# Patient Record
Sex: Female | Born: 1960 | Race: White | Hispanic: No | Marital: Single | Smoking: Never smoker
Health system: Southern US, Community
[De-identification: ages and names within clinical notes are randomized; demographics above are authoritative.]

## PROBLEM LIST (undated history)

## (undated) DIAGNOSIS — I10 Essential (primary) hypertension: Secondary | ICD-10-CM

## (undated) DIAGNOSIS — E0500 Thyrotoxicosis with diffuse goiter without thyrotoxic crisis or storm: Secondary | ICD-10-CM

## (undated) DIAGNOSIS — E039 Hypothyroidism, unspecified: Secondary | ICD-10-CM

## (undated) HISTORY — PX: COLONOSCOPY: SHX174

---

## 2004-06-25 ENCOUNTER — Ambulatory Visit: Payer: Self-pay | Admitting: Unknown Physician Specialty

## 2004-08-12 ENCOUNTER — Ambulatory Visit: Payer: Self-pay | Admitting: Unknown Physician Specialty

## 2005-11-15 ENCOUNTER — Ambulatory Visit: Payer: Self-pay | Admitting: Unknown Physician Specialty

## 2007-01-10 ENCOUNTER — Ambulatory Visit: Payer: Self-pay | Admitting: Unknown Physician Specialty

## 2007-02-02 ENCOUNTER — Ambulatory Visit: Payer: Self-pay | Admitting: Unknown Physician Specialty

## 2008-02-21 ENCOUNTER — Ambulatory Visit: Payer: Self-pay | Admitting: Unknown Physician Specialty

## 2009-06-02 ENCOUNTER — Ambulatory Visit: Payer: Self-pay | Admitting: Unknown Physician Specialty

## 2010-06-07 ENCOUNTER — Ambulatory Visit: Payer: Self-pay | Admitting: Unknown Physician Specialty

## 2011-09-15 ENCOUNTER — Ambulatory Visit: Payer: Self-pay | Admitting: Unknown Physician Specialty

## 2012-08-20 ENCOUNTER — Ambulatory Visit: Payer: Self-pay | Admitting: Gastroenterology

## 2012-09-18 ENCOUNTER — Ambulatory Visit: Payer: Self-pay | Admitting: Physician Assistant

## 2013-09-19 ENCOUNTER — Ambulatory Visit: Payer: Self-pay | Admitting: Physician Assistant

## 2013-09-25 ENCOUNTER — Ambulatory Visit: Payer: Self-pay | Admitting: Physician Assistant

## 2014-10-06 ENCOUNTER — Ambulatory Visit: Payer: Self-pay | Admitting: Physician Assistant

## 2015-09-17 ENCOUNTER — Other Ambulatory Visit: Payer: Self-pay | Admitting: Physician Assistant

## 2015-09-17 DIAGNOSIS — Z1231 Encounter for screening mammogram for malignant neoplasm of breast: Secondary | ICD-10-CM

## 2015-10-08 ENCOUNTER — Ambulatory Visit
Admission: RE | Admit: 2015-10-08 | Discharge: 2015-10-08 | Disposition: A | Discharge status: Home / Self Care | Payer: BLUE CROSS/BLUE SHIELD | Source: Ambulatory Visit | Attending: Physician Assistant | Admitting: Physician Assistant

## 2015-10-08 ENCOUNTER — Ambulatory Visit: Payer: Self-pay

## 2015-10-08 DIAGNOSIS — Z1231 Encounter for screening mammogram for malignant neoplasm of breast: Secondary | ICD-10-CM | POA: Insufficient documentation

## 2015-11-20 ENCOUNTER — Encounter: Payer: Self-pay | Admitting: *Deleted

## 2015-11-23 ENCOUNTER — Encounter
Admission: RE | Disposition: A | Discharge status: Home / Self Care | Payer: Self-pay | Source: Ambulatory Visit | Attending: Gastroenterology

## 2015-11-23 ENCOUNTER — Encounter: Payer: Self-pay | Admitting: *Deleted

## 2015-11-23 ENCOUNTER — Ambulatory Visit: Payer: BLUE CROSS/BLUE SHIELD | Admitting: Anesthesiology

## 2015-11-23 ENCOUNTER — Ambulatory Visit
Admission: RE | Admit: 2015-11-23 | Discharge: 2015-11-23 | Disposition: A | Discharge status: Home / Self Care | Payer: BLUE CROSS/BLUE SHIELD | Source: Ambulatory Visit | Attending: Gastroenterology | Admitting: Gastroenterology

## 2015-11-23 DIAGNOSIS — Z79899 Other long term (current) drug therapy: Secondary | ICD-10-CM | POA: Insufficient documentation

## 2015-11-23 DIAGNOSIS — I10 Essential (primary) hypertension: Secondary | ICD-10-CM | POA: Diagnosis not present

## 2015-11-23 DIAGNOSIS — K5730 Diverticulosis of large intestine without perforation or abscess without bleeding: Secondary | ICD-10-CM | POA: Insufficient documentation

## 2015-11-23 DIAGNOSIS — K621 Rectal polyp: Secondary | ICD-10-CM | POA: Diagnosis not present

## 2015-11-23 DIAGNOSIS — Z1211 Encounter for screening for malignant neoplasm of colon: Secondary | ICD-10-CM | POA: Diagnosis not present

## 2015-11-23 DIAGNOSIS — E039 Hypothyroidism, unspecified: Secondary | ICD-10-CM | POA: Diagnosis not present

## 2015-11-23 HISTORY — DX: Thyrotoxicosis with diffuse goiter without thyrotoxic crisis or storm: E05.00

## 2015-11-23 HISTORY — PX: COLONOSCOPY WITH PROPOFOL: SHX5780

## 2015-11-23 HISTORY — DX: Hypothyroidism, unspecified: E03.9

## 2015-11-23 HISTORY — DX: Essential (primary) hypertension: I10

## 2015-11-23 SURGERY — COLONOSCOPY WITH PROPOFOL
Anesthesia: General

## 2015-11-23 MED ORDER — PROPOFOL 10 MG/ML IV BOLUS
INTRAVENOUS | Status: DC | PRN
  Administered 2015-11-23 (×2): 40 mg via INTRAVENOUS

## 2015-11-23 MED ORDER — SODIUM CHLORIDE 0.9 % IV SOLN
INTRAVENOUS | Status: DC
  Administered 2015-11-23: 12:00:00 via INTRAVENOUS
  Administered 2015-11-23: 1000 mL via INTRAVENOUS

## 2015-11-23 MED ORDER — MIDAZOLAM HCL 5 MG/5ML IJ SOLN
INTRAMUSCULAR | Status: DC | PRN
  Administered 2015-11-23: 1 mg via INTRAVENOUS

## 2015-11-23 MED ORDER — PROPOFOL 500 MG/50ML IV EMUL
INTRAVENOUS | Status: DC | PRN
Start: 2015-11-23 — End: 2015-11-23
  Administered 2015-11-23: 150 ug/kg/min via INTRAVENOUS

## 2015-11-23 MED ORDER — SODIUM CHLORIDE 0.9 % IV SOLN
INTRAVENOUS | Status: DC
  Administered 2015-11-23: 12:00:00 via INTRAVENOUS

## 2015-11-23 MED ORDER — FENTANYL CITRATE (PF) 100 MCG/2ML IJ SOLN
INTRAMUSCULAR | Status: DC | PRN
  Administered 2015-11-23: 50 ug via INTRAVENOUS

## 2015-11-23 NOTE — Anesthesia Preprocedure Evaluation (Signed)
Anesthesia Evaluation  Patient identified by MRN, date of birth, ID band Patient awake    Reviewed: Allergy & Precautions, NPO status   Airway Mallampati: II       Dental  (+) Teeth Intact   Pulmonary neg pulmonary ROS,    Pulmonary exam normal        Cardiovascular Exercise Tolerance: Good hypertension, Pt. on medications  Rhythm:Regular     Neuro/Psych negative neurological ROS     GI/Hepatic Neg liver ROS,   Endo/Other  Hypothyroidism   Renal/GU negative Renal ROS     Musculoskeletal negative musculoskeletal ROS (+)   Abdominal Normal abdominal exam  (+)   Peds  Hematology   Anesthesia Other Findings   Reproductive/Obstetrics negative OB ROS                             Anesthesia Physical Anesthesia Plan  ASA: II  Anesthesia Plan: General   Post-op Pain Management:    Induction: Intravenous  Airway Management Planned: Natural Airway and Nasal Cannula  Additional Equipment:   Intra-op Plan:   Post-operative Plan:   Informed Consent: I have reviewed the patients History and Physical, chart, labs and discussed the procedure including the risks, benefits and alternatives for the proposed anesthesia with the patient or authorized representative who has indicated his/her understanding and acceptance.     Plan Discussed with: CRNA  Anesthesia Plan Comments:         Anesthesia Quick Evaluation

## 2015-11-23 NOTE — Op Note (Signed)
[facility] Gastroenterology Patient Name: Martha Cohen Procedure Date: 11/23/2015 11:18 AM MRN: [id number] Account #: [id number] Date of Birth: 04/09/1961 Admit Type: Outpatient Age: 55 Room: [facility] ENDO ROOM 3 Gender: Female Note Status: Finalized Procedure:            Colonoscopy Indications:          Personal history of colonic polyps Providers:            Christena Deem, MD Referring MD:         Marilynne Halsted, MD (Referring MD) Medicines:            Monitored Anesthesia Care Complications:        No immediate complications. Procedure:            Pre-Anesthesia Assessment:                       - ASA Grade Assessment: II - A patient with mild                        systemic disease.                       After obtaining informed consent, the colonoscope was                        passed under direct vision. Throughout the procedure,                        the patient's blood pressure, pulse, and oxygen                        saturations were monitored continuously. The                        Colonoscope was introduced through the anus and                        advanced to the the cecum, identified by appendiceal                        orifice and ileocecal valve. The quality of the bowel                        preparation was good. Findings:      Multiple small-mouthed diverticula were found in the sigmoid colon and       descending colon.      A localized area of mildly granular mucosa was found at the ileocecal       valve. This was biopsied/removed with a cold forceps for histology.      A 2 mm polyp was found in the rectum. The polyp was sessile. The polyp       was removed with a cold biopsy forceps. Resection and retrieval were       complete.      The digital rectal exam was normal. Impression:           - Diverticulosis in the sigmoid colon and in the                        descending colon.                       -  Granular mucosa  at the ileocecal valve. Biopsied.                       - One 2 mm polyp in the rectum, removed with a cold                        biopsy forceps. Resected and retrieved. Recommendation:       - Discharge patient to home.                       - Await pathology results.                       - Telephone GI clinic for pathology results in 1 week. Procedure Code(s):    --- Professional ---                       [phone], Colonoscopy, flexible; with biopsy, single or                        multiple Diagnosis Code(s):    --- Professional ---                       K63.89, Other specified diseases of intestine                       K62.1, Rectal polyp                       Z86.010, Personal history of colonic polyps                       K57.30, Diverticulosis of large intestine without                        perforation or abscess without bleeding CPT copyright 2016 American [employer]. All rights reserved. The codes documented in this report are preliminary and upon coder review may  be revised to meet current compliance requirements. Christena DeemMartin U Skulskie, MD 11/23/2015 12:03:18 PM This report has been signed electronically. Number of Addenda: 0 Note Initiated On: 11/23/2015 11:18 AM Scope Withdrawal Time: 0 hours 12 minutes 56 seconds  Total Procedure Duration: 0 hours 20 minutes 2 seconds       [facility]

## 2015-11-23 NOTE — Transfer of Care (Signed)
Immediate Anesthesia Transfer of Care Note  Patient: Martha Cohen  Procedure(s) Performed: Procedure(s): COLONOSCOPY WITH PROPOFOL (N/A)  Patient Location: PACU  Anesthesia Type:General  Level of Consciousness: awake  Airway & Oxygen Therapy: Patient Spontanous Breathing  Post-op Assessment: Report given to RN  Post vital signs: Reviewed and stable  Last Vitals:  Filed Vitals:   11/23/15 1026  BP: 134/83  Pulse: 89  Temp: 36.8 C  Resp: 18    Complications: No apparent anesthesia complications

## 2015-11-23 NOTE — Anesthesia Procedure Notes (Signed)
Date/Time: 11/23/2015 11:30 AM Performed by: Henrietta HooverPOPE, Javonna Balli Pre-anesthesia Checklist: Patient identified, Emergency Drugs available, Suction available, Patient being monitored and Timeout performed Patient Re-evaluated:Patient Re-evaluated prior to inductionOxygen Delivery Method: Nasal cannula Intubation Type: IV induction

## 2015-11-23 NOTE — H&P (Signed)
Outpatient short stay form Pre-procedure 11/23/2015 11:28 AM Martha DeemMartin U Quyen Cutsforth MD  Primary Physician: Dr. Angus PalmsSionne George  Reason for visit:  Colonoscopy  History of present illness:  Patient is a 55 year old female presenting today for a colonoscopy. She has a personal history of adenomatous colon polyps. She tolerated her prep well. She did take an aspirin product about 3 days ago. She takes no other aspirin or blood thinning    Current facility-administered medications:  .  0.9 %  sodium chloride infusion, , Intravenous, Continuous, Martha DeemMartin U Latoya Maulding, MD, Last Rate: 20 mL/hr at 11/23/15 1043, 1,000 mL at 11/23/15 1043 .  0.9 %  sodium chloride infusion, , Intravenous, Continuous, Martha DeemMartin U Cataleah Stites, MD  Prescriptions prior to admission  Medication Sig Dispense Refill Last Dose  . cholecalciferol (VITAMIN D) 1000 units tablet Take 2,000 Units by mouth daily.     . hydrochlorothiazide (HYDRODIURIL) 25 MG tablet Take 25 mg by mouth daily.     Marland Kitchen. levothyroxine (SYNTHROID, LEVOTHROID) 100 MCG tablet Take 100 mcg by mouth daily before breakfast.   11/23/2015 at 0545  . meloxicam (MOBIC) 7.5 MG tablet Take 7.5 mg by mouth daily.     . norgestrel-ethinyl estradiol (LO/OVRAL,CRYSELLE) 0.3-30 MG-MCG tablet Take 1 tablet by mouth daily.        Not on File   Past Medical History  Diagnosis Date  . Graves disease   . Hypertension   . Hypothyroidism     Review of systems:      Physical Exam    Heart and lungs: Regular rate and rhythm without rub or gallop, lungs are bilaterally    HEENT: Normocephalic atraumatic eyes are anicteric    Other:     Pertinant exam for procedure: Soft nontender nondistended bowel sounds positive normoactive.    Planned proceedures: Colonoscopy and indicated procedures. I have discussed the risks benefits and complications of procedures to include not limited to bleeding, infection, perforation and the risk of sedation and the patient wishes to  proceed.    Martha DeemMartin U Milan Perkins, MD Gastroenterology 11/23/2015  11:28 AM

## 2015-11-24 ENCOUNTER — Encounter: Payer: Self-pay | Admitting: Gastroenterology

## 2015-11-24 LAB — SURGICAL PATHOLOGY

## 2015-11-24 NOTE — Anesthesia Postprocedure Evaluation (Signed)
Anesthesia Post Note  Patient: Martha Cohen  Procedure(s) Performed: Procedure(s) (LRB): COLONOSCOPY WITH PROPOFOL (N/A)  Patient location during evaluation: PACU Anesthesia Type: General Level of consciousness: awake Pain management: pain level controlled Vital Signs Assessment: post-procedure vital signs reviewed and stable Respiratory status: spontaneous breathing Cardiovascular status: blood pressure returned to baseline Anesthetic complications: no    Last Vitals:  Filed Vitals:   11/23/15 1231 11/23/15 1242  BP: 98/84 134/79  Pulse: 58 60  Temp:    Resp: 19 16    Last Pain: There were no vitals filed for this visit.               VAN STAVEREN,Monique Hefty

## 2016-10-03 ENCOUNTER — Other Ambulatory Visit: Payer: Self-pay | Admitting: Physician Assistant

## 2016-10-03 DIAGNOSIS — Z1231 Encounter for screening mammogram for malignant neoplasm of breast: Secondary | ICD-10-CM

## 2016-11-01 ENCOUNTER — Ambulatory Visit
Admission: RE | Admit: 2016-11-01 | Discharge: 2016-11-01 | Disposition: A | Discharge status: Home / Self Care | Payer: BLUE CROSS/BLUE SHIELD | Source: Ambulatory Visit | Attending: Physician Assistant | Admitting: Physician Assistant

## 2016-11-01 DIAGNOSIS — Z1231 Encounter for screening mammogram for malignant neoplasm of breast: Secondary | ICD-10-CM

## 2017-10-31 ENCOUNTER — Other Ambulatory Visit: Payer: Self-pay | Admitting: Physician Assistant

## 2017-10-31 DIAGNOSIS — Z1231 Encounter for screening mammogram for malignant neoplasm of breast: Secondary | ICD-10-CM

## 2017-11-15 ENCOUNTER — Ambulatory Visit
Admission: RE | Admit: 2017-11-15 | Discharge: 2017-11-15 | Disposition: A | Discharge status: Home / Self Care | Payer: BLUE CROSS/BLUE SHIELD | Source: Ambulatory Visit | Attending: Physician Assistant | Admitting: Physician Assistant

## 2017-11-15 DIAGNOSIS — Z1231 Encounter for screening mammogram for malignant neoplasm of breast: Secondary | ICD-10-CM

## 2018-10-08 ENCOUNTER — Other Ambulatory Visit: Payer: Self-pay | Admitting: Physician Assistant

## 2018-10-08 DIAGNOSIS — Z1231 Encounter for screening mammogram for malignant neoplasm of breast: Secondary | ICD-10-CM

## 2018-11-19 ENCOUNTER — Ambulatory Visit
Admission: RE | Admit: 2018-11-19 | Discharge: 2018-11-19 | Disposition: A | Discharge status: Home / Self Care | Payer: BLUE CROSS/BLUE SHIELD | Source: Ambulatory Visit | Attending: Physician Assistant | Admitting: Physician Assistant

## 2018-11-19 DIAGNOSIS — Z1231 Encounter for screening mammogram for malignant neoplasm of breast: Secondary | ICD-10-CM | POA: Insufficient documentation

## 2019-10-14 ENCOUNTER — Other Ambulatory Visit: Payer: Self-pay | Admitting: Physician Assistant

## 2019-10-14 DIAGNOSIS — Z1231 Encounter for screening mammogram for malignant neoplasm of breast: Secondary | ICD-10-CM

## 2019-11-20 ENCOUNTER — Ambulatory Visit
Admission: RE | Admit: 2019-11-20 | Discharge: 2019-11-20 | Disposition: A | Discharge status: Home / Self Care | Payer: BC Managed Care – PPO | Source: Ambulatory Visit | Attending: Physician Assistant | Admitting: Physician Assistant

## 2019-11-20 DIAGNOSIS — Z1231 Encounter for screening mammogram for malignant neoplasm of breast: Secondary | ICD-10-CM

## 2019-11-26 ENCOUNTER — Other Ambulatory Visit: Payer: Self-pay | Admitting: Physician Assistant

## 2019-11-26 DIAGNOSIS — N6489 Other specified disorders of breast: Secondary | ICD-10-CM

## 2019-11-26 DIAGNOSIS — R928 Other abnormal and inconclusive findings on diagnostic imaging of breast: Secondary | ICD-10-CM

## 2019-11-26 DIAGNOSIS — N6310 Unspecified lump in the right breast, unspecified quadrant: Secondary | ICD-10-CM

## 2019-11-29 ENCOUNTER — Ambulatory Visit
Admission: RE | Admit: 2019-11-29 | Discharge: 2019-11-29 | Disposition: A | Discharge status: Home / Self Care | Payer: BC Managed Care – PPO | Source: Ambulatory Visit | Attending: Physician Assistant | Admitting: Physician Assistant

## 2019-11-29 DIAGNOSIS — R928 Other abnormal and inconclusive findings on diagnostic imaging of breast: Secondary | ICD-10-CM | POA: Diagnosis not present

## 2019-11-29 DIAGNOSIS — N6310 Unspecified lump in the right breast, unspecified quadrant: Secondary | ICD-10-CM | POA: Insufficient documentation

## 2019-11-29 DIAGNOSIS — N6489 Other specified disorders of breast: Secondary | ICD-10-CM

## 2019-12-06 ENCOUNTER — Other Ambulatory Visit: Payer: BC Managed Care – PPO

## 2019-12-06 ENCOUNTER — Ambulatory Visit: Payer: BC Managed Care – PPO

## 2020-10-22 ENCOUNTER — Other Ambulatory Visit: Payer: Self-pay | Admitting: Physician Assistant

## 2020-10-22 DIAGNOSIS — Z1231 Encounter for screening mammogram for malignant neoplasm of breast: Secondary | ICD-10-CM

## 2020-11-30 ENCOUNTER — Other Ambulatory Visit: Payer: Self-pay

## 2020-11-30 ENCOUNTER — Ambulatory Visit
Admission: RE | Admit: 2020-11-30 | Discharge: 2020-11-30 | Disposition: A | Discharge status: Home / Self Care | Payer: BC Managed Care – PPO | Source: Ambulatory Visit | Attending: Physician Assistant | Admitting: Physician Assistant

## 2020-11-30 DIAGNOSIS — Z1231 Encounter for screening mammogram for malignant neoplasm of breast: Secondary | ICD-10-CM | POA: Diagnosis present

## 2021-10-25 ENCOUNTER — Other Ambulatory Visit: Payer: Self-pay | Admitting: Physician Assistant

## 2021-10-25 DIAGNOSIS — Z1231 Encounter for screening mammogram for malignant neoplasm of breast: Secondary | ICD-10-CM

## 2021-12-01 ENCOUNTER — Other Ambulatory Visit: Payer: Self-pay

## 2021-12-01 ENCOUNTER — Ambulatory Visit
Admission: RE | Admit: 2021-12-01 | Discharge: 2021-12-01 | Disposition: A | Discharge status: Home / Self Care | Payer: BC Managed Care – PPO | Source: Ambulatory Visit | Attending: Physician Assistant | Admitting: Physician Assistant

## 2021-12-01 DIAGNOSIS — Z1231 Encounter for screening mammogram for malignant neoplasm of breast: Secondary | ICD-10-CM | POA: Insufficient documentation

## 2022-06-21 ENCOUNTER — Other Ambulatory Visit (HOSPITAL_BASED_OUTPATIENT_CLINIC_OR_DEPARTMENT_OTHER): Payer: Self-pay

## 2022-06-21 MED ORDER — FLUARIX QUADRIVALENT 0.5 ML IM SUSY
PREFILLED_SYRINGE | INTRAMUSCULAR | 0 refills | Status: DC
  Filled 2022-06-21: qty 0.5, 1d supply, fill #0

## 2022-10-13 IMAGING — MG MM DIGITAL SCREENING BILAT W/ TOMO AND CAD
8 series · 8 of 24 positions shown · non-contrast
Comparison: Previous exam(s).

CLINICAL DATA: Screening.

EXAM:
DIGITAL SCREENING BILATERAL MAMMOGRAM WITH TOMOSYNTHESIS AND CAD
TECHNIQUE: Bilateral screening digital craniocaudal and mediolateral oblique
mammograms were obtained. Bilateral screening digital breast
tomosynthesis was performed. The images were evaluated with
computer-aided detection.

[R CC synth-2D]
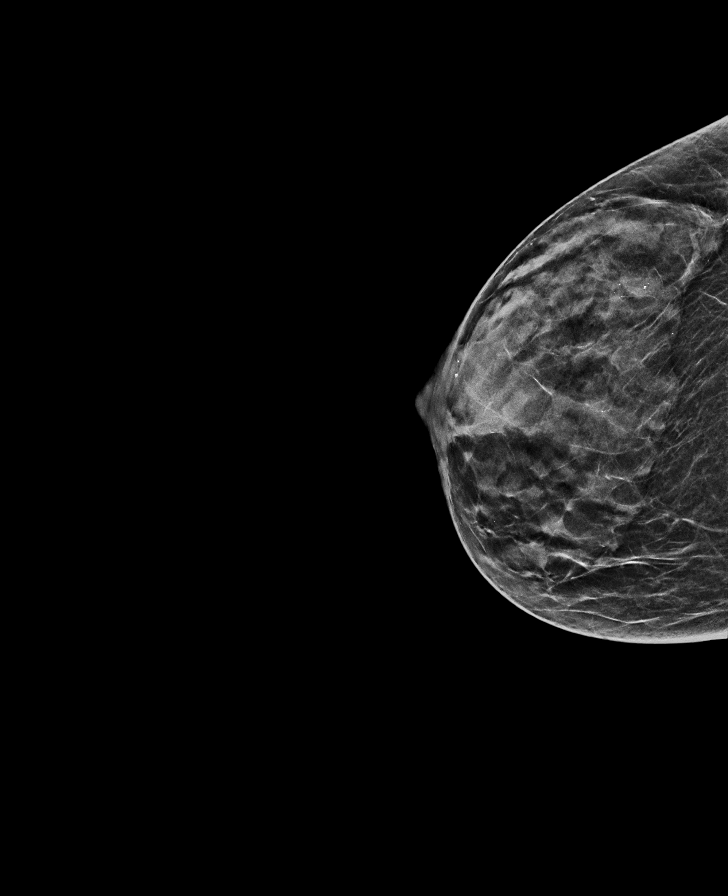

[L MLO synth-2D]
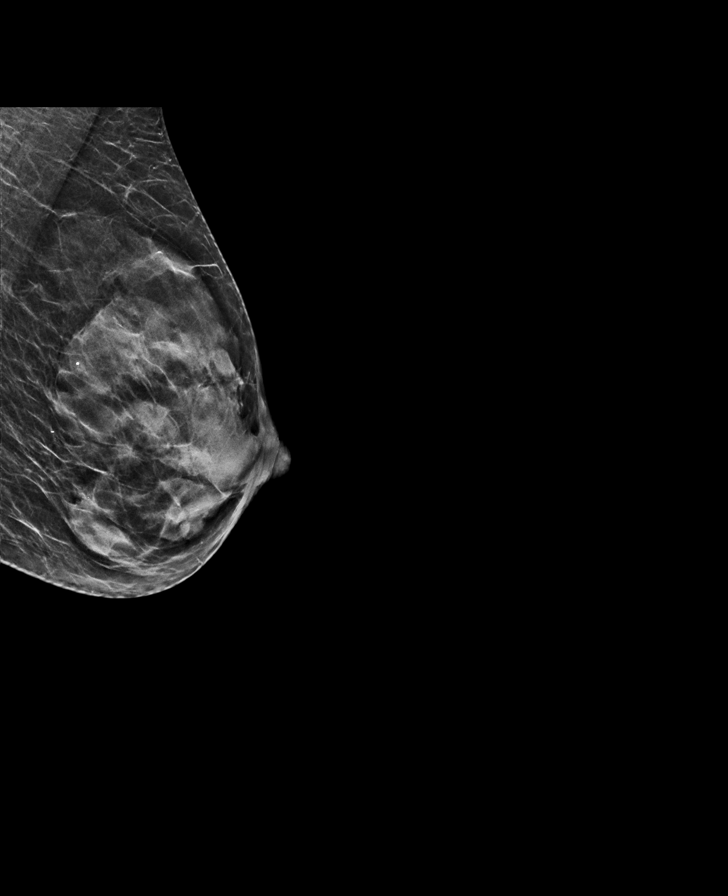

[R MLO synth-2D]
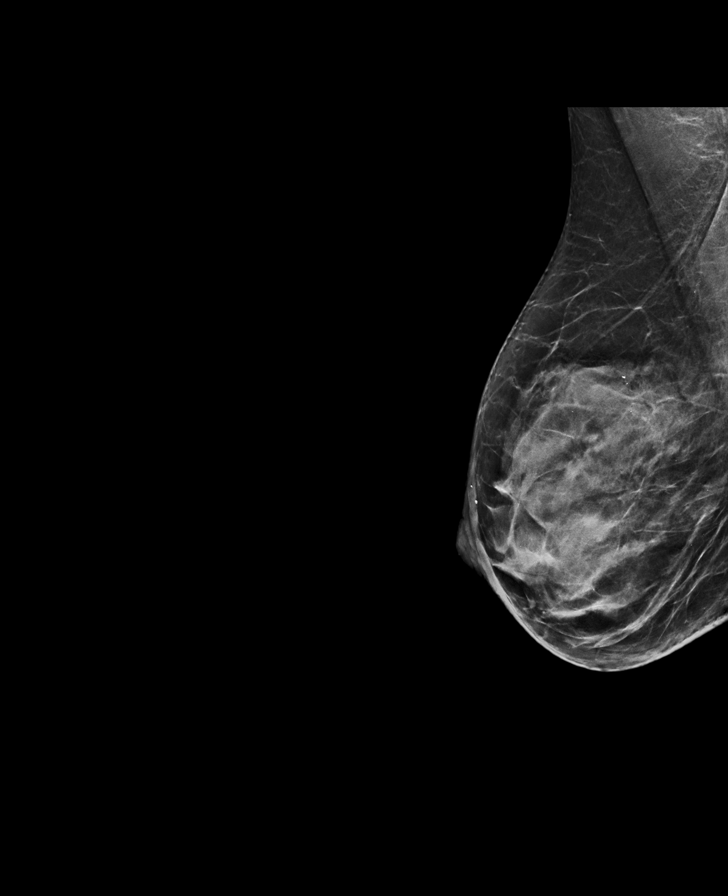

[L CC synth-2D]
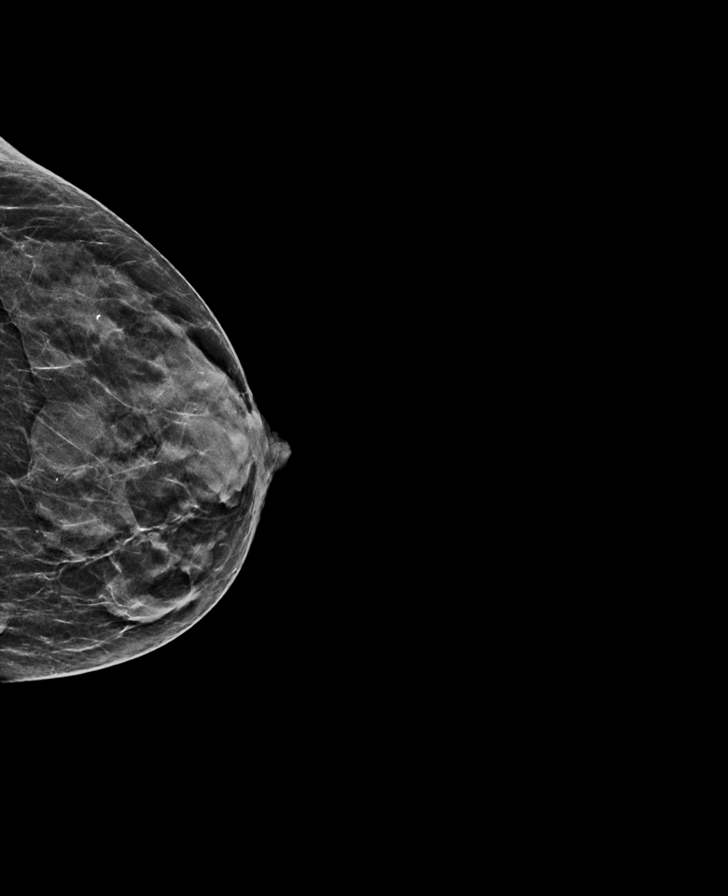

[L MLO tomo · tomo slice 23/46.0]
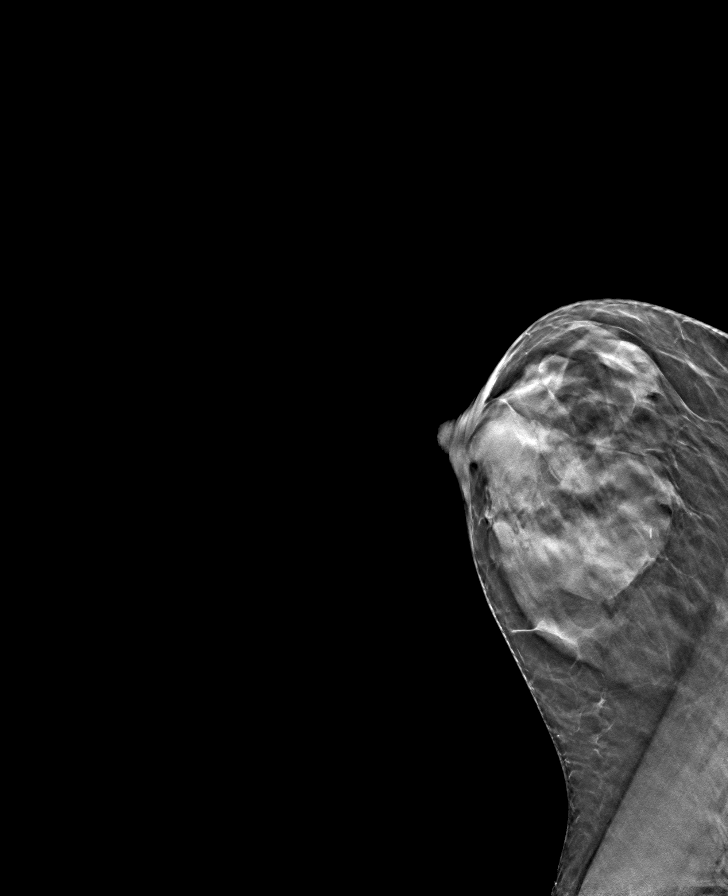

[L CC tomo · tomo slice 21/41.0]
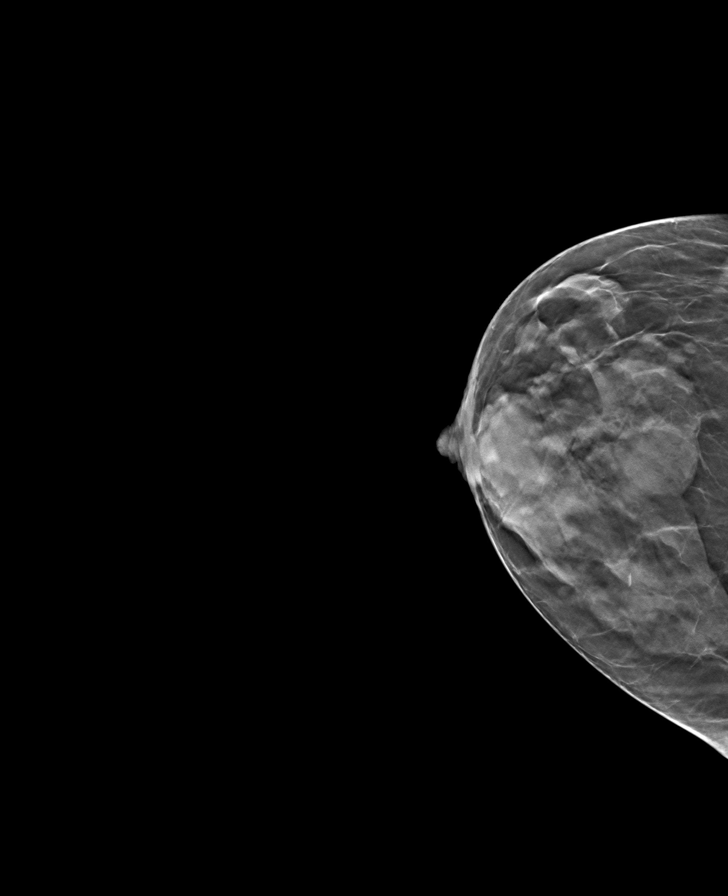

[R CC tomo · tomo slice 22/43.0]
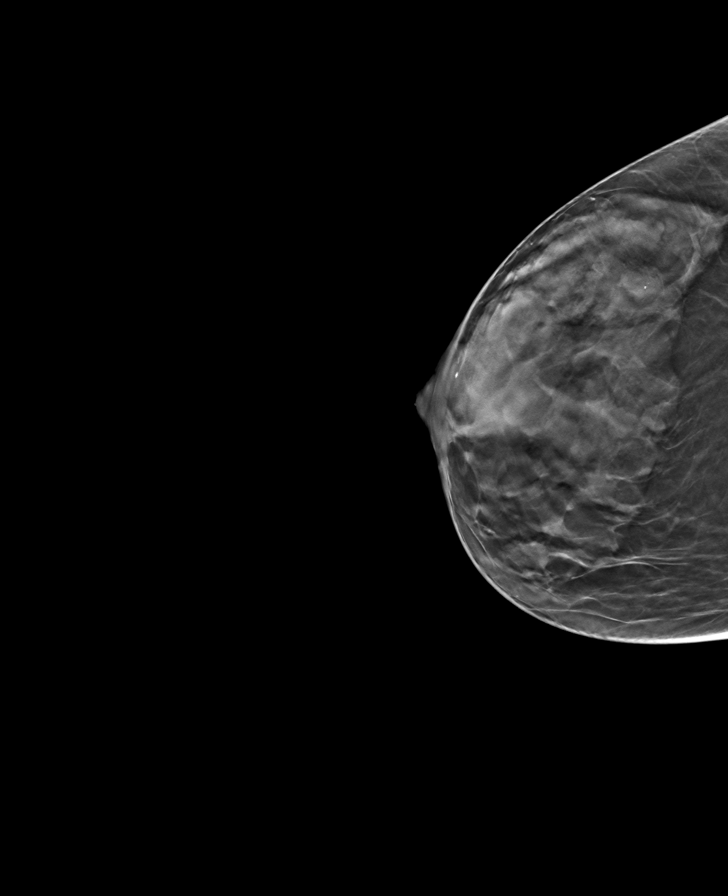

[R MLO tomo · tomo slice 29/56.0]
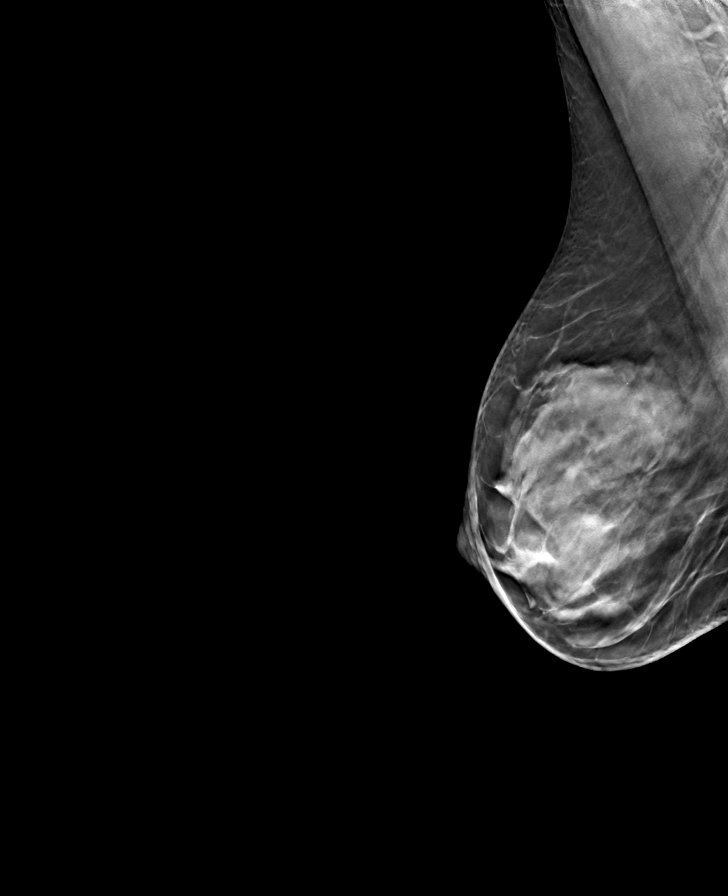

[8 of 24 positions shown; findings below may reference images not displayed]

ACR Breast Density Category d: The breast tissue is extremely dense,
which lowers the sensitivity of mammography
FINDINGS: There are no findings suspicious for malignancy. The images were
evaluated with computer-aided detection.
IMPRESSION: No mammographic evidence of malignancy. A result letter of this
screening mammogram will be mailed directly to the patient.

RECOMMENDATION:
Screening mammogram in one year. (Code:95-0-E9V)

BI-RADS CATEGORY  1: Negative.

## 2022-10-18 ENCOUNTER — Other Ambulatory Visit: Payer: Self-pay | Admitting: Physician Assistant

## 2022-10-18 DIAGNOSIS — Z1231 Encounter for screening mammogram for malignant neoplasm of breast: Secondary | ICD-10-CM

## 2022-12-06 ENCOUNTER — Ambulatory Visit
Admission: RE | Admit: 2022-12-06 | Discharge: 2022-12-06 | Disposition: A | Discharge status: Home / Self Care | Payer: BC Managed Care – PPO | Source: Ambulatory Visit | Attending: Physician Assistant | Admitting: Physician Assistant

## 2022-12-06 DIAGNOSIS — Z1231 Encounter for screening mammogram for malignant neoplasm of breast: Secondary | ICD-10-CM | POA: Insufficient documentation

## 2023-10-23 ENCOUNTER — Other Ambulatory Visit: Payer: Self-pay | Admitting: Physician Assistant

## 2023-10-23 DIAGNOSIS — Z1231 Encounter for screening mammogram for malignant neoplasm of breast: Secondary | ICD-10-CM

## 2023-12-07 ENCOUNTER — Ambulatory Visit
Admission: RE | Admit: 2023-12-07 | Discharge: 2023-12-07 | Disposition: A | Discharge status: Home / Self Care | Payer: PRIVATE HEALTH INSURANCE | Source: Ambulatory Visit | Attending: Physician Assistant | Admitting: Physician Assistant

## 2023-12-07 DIAGNOSIS — Z1231 Encounter for screening mammogram for malignant neoplasm of breast: Secondary | ICD-10-CM | POA: Insufficient documentation

## 2024-04-02 ENCOUNTER — Encounter: Payer: Self-pay | Admitting: Ophthalmology

## 2024-04-02 NOTE — Anesthesia Preprocedure Evaluation (Addendum)
 Anesthesia Evaluation  Patient identified by MRN, date of birth, ID band Patient awake    Reviewed: Allergy & Precautions, H&P , NPO status , Patient's Chart, lab work & pertinent test results  Airway Mallampati: I  TM Distance: >3 FB Neck ROM: Full    Dental no notable dental hx. (+) Teeth Intact Beautiful perfect teeth:   Pulmonary neg pulmonary ROS   Pulmonary exam normal breath sounds clear to auscultation       Cardiovascular hypertension, Normal cardiovascular exam Rhythm:Regular Rate:Normal     Neuro/Psych negative neurological ROS  negative psych ROS   GI/Hepatic negative GI ROS, Neg liver ROS,,,  Endo/Other  negative endocrine ROSHypothyroidism    Renal/GU negative Renal ROS  negative genitourinary   Musculoskeletal negative musculoskeletal ROS (+)    Abdominal   Peds negative pediatric ROS (+)  Hematology negative hematology ROS (+)   Anesthesia Other Findings Graves disease  hypothyroidism HTN   Reproductive/Obstetrics negative OB ROS                              Anesthesia Physical Anesthesia Plan  ASA: 2  Anesthesia Plan: MAC   Post-op Pain Management:    Induction: Intravenous  PONV Risk Score and Plan:   Airway Management Planned: Natural Airway and Nasal Cannula  Additional Equipment:   Intra-op Plan:   Post-operative Plan:   Informed Consent: I have reviewed the patients History and Physical, chart, labs and discussed the procedure including the risks, benefits and alternatives for the proposed anesthesia with the patient or authorized representative who has indicated his/her understanding and acceptance.     Dental Advisory Given  Plan Discussed with: Anesthesiologist, CRNA and Surgeon  Anesthesia Plan Comments: (Patient consented for risks of anesthesia including but not limited to:  - adverse reactions to medications - damage to eyes, teeth,  lips or other oral mucosa - nerve damage due to positioning  - sore throat or hoarseness - Damage to heart, brain, nerves, lungs, other parts of body or loss of life  Patient voiced understanding and assent.)         Anesthesia Quick Evaluation

## 2024-04-08 NOTE — Discharge Instructions (Signed)

## 2024-04-10 ENCOUNTER — Ambulatory Visit: Payer: PRIVATE HEALTH INSURANCE | Admitting: Anesthesiology

## 2024-04-10 ENCOUNTER — Ambulatory Visit
Admission: RE | Admit: 2024-04-10 | Discharge: 2024-04-10 | Disposition: A | Discharge status: Home / Self Care | Payer: PRIVATE HEALTH INSURANCE | Attending: Ophthalmology | Admitting: Ophthalmology

## 2024-04-10 ENCOUNTER — Other Ambulatory Visit: Payer: Self-pay

## 2024-04-10 ENCOUNTER — Encounter
Admission: RE | Disposition: A | Discharge status: Home / Self Care | Payer: Self-pay | Source: Home / Self Care | Attending: Ophthalmology

## 2024-04-10 DIAGNOSIS — I10 Essential (primary) hypertension: Secondary | ICD-10-CM | POA: Diagnosis not present

## 2024-04-10 DIAGNOSIS — E039 Hypothyroidism, unspecified: Secondary | ICD-10-CM | POA: Insufficient documentation

## 2024-04-10 DIAGNOSIS — H2512 Age-related nuclear cataract, left eye: Secondary | ICD-10-CM | POA: Insufficient documentation

## 2024-04-10 HISTORY — PX: CATARACT EXTRACTION W/PHACO: SHX586

## 2024-04-10 SURGERY — PHACOEMULSIFICATION, CATARACT, WITH IOL INSERTION
Anesthesia: Monitor Anesthesia Care | Site: Eye | Laterality: Left

## 2024-04-10 MED ORDER — ARMC OPHTHALMIC DILATING DROPS
OPHTHALMIC | Status: AC
  Filled 2024-04-10: qty 0.5

## 2024-04-10 MED ORDER — BRIMONIDINE TARTRATE-TIMOLOL 0.2-0.5 % OP SOLN
OPHTHALMIC | Status: DC | PRN
  Administered 2024-04-10: 1 [drp] via OPHTHALMIC

## 2024-04-10 MED ORDER — TETRACAINE HCL 0.5 % OP SOLN
OPHTHALMIC | Status: AC
  Filled 2024-04-10: qty 4

## 2024-04-10 MED ORDER — LACTATED RINGERS IV SOLN
INTRAVENOUS | Status: DC
Start: 2024-04-10 — End: 2024-04-10

## 2024-04-10 MED ORDER — MIDAZOLAM HCL 5 MG/5ML IJ SOLN
INTRAMUSCULAR | Status: DC | PRN
  Administered 2024-04-10 (×2): 1 mg via INTRAVENOUS

## 2024-04-10 MED ORDER — FENTANYL CITRATE (PF) 100 MCG/2ML IJ SOLN
INTRAMUSCULAR | Status: AC
  Filled 2024-04-10: qty 2

## 2024-04-10 MED ORDER — SIGHTPATH DOSE#1 BSS IO SOLN
INTRAOCULAR | Status: DC | PRN
  Administered 2024-04-10: 15 mL via INTRAOCULAR

## 2024-04-10 MED ORDER — SIGHTPATH DOSE#1 NA HYALUR & NA CHOND-NA HYALUR IO KIT
PACK | INTRAOCULAR | Status: DC | PRN
  Administered 2024-04-10: 1 via OPHTHALMIC

## 2024-04-10 MED ORDER — TETRACAINE HCL 0.5 % OP SOLN
1.0000 [drp] | OPHTHALMIC | Status: AC | PRN
  Administered 2024-04-10 (×3): 1 [drp] via OPHTHALMIC

## 2024-04-10 MED ORDER — FENTANYL CITRATE (PF) 100 MCG/2ML IJ SOLN
INTRAMUSCULAR | Status: DC | PRN
  Administered 2024-04-10 (×2): 50 ug via INTRAVENOUS

## 2024-04-10 MED ORDER — CEFUROXIME OPHTHALMIC INJECTION 1 MG/0.1 ML
INJECTION | OPHTHALMIC | Status: DC | PRN
  Administered 2024-04-10: .1 mL via INTRACAMERAL

## 2024-04-10 MED ORDER — SIGHTPATH DOSE#1 BSS IO SOLN
INTRAOCULAR | Status: DC | PRN
  Administered 2024-04-10: 45 mL via OPHTHALMIC

## 2024-04-10 MED ORDER — ARMC OPHTHALMIC DILATING DROPS
1.0000 | OPHTHALMIC | Status: AC | PRN
  Administered 2024-04-10 (×3): 1 via OPHTHALMIC

## 2024-04-10 MED ORDER — LIDOCAINE HCL (PF) 2 % IJ SOLN
INTRAOCULAR | Status: DC | PRN
  Administered 2024-04-10: 2 mL

## 2024-04-10 MED ORDER — MIDAZOLAM HCL 2 MG/2ML IJ SOLN
INTRAMUSCULAR | Status: AC
  Filled 2024-04-10: qty 2

## 2024-04-10 SURGICAL SUPPLY — 9 items
CATARACT SUITE SIGHTPATH (MISCELLANEOUS) ×1 IMPLANT
FEE CATARACT SUITE SIGHTPATH (MISCELLANEOUS) ×1 IMPLANT
GLOVE BIOGEL PI IND STRL 8 (GLOVE) ×1 IMPLANT
GLOVE SURG LX STRL 7.5 STRW (GLOVE) ×1 IMPLANT
GLOVE SURG SYN 6.5 PF PI BL (GLOVE) ×1 IMPLANT
LENS IOL CLRN PAN PRO TORIC 11 IMPLANT
NDL FILTER BLUNT 18X1 1/2 (NEEDLE) ×1 IMPLANT
NEEDLE FILTER BLUNT 18X1 1/2 (NEEDLE) ×1 IMPLANT
SYR 3ML LL SCALE MARK (SYRINGE) ×1 IMPLANT

## 2024-04-10 NOTE — Op Note (Signed)
 LOCATION:  [facility]   PREOPERATIVE DIAGNOSIS:  Nuclear sclerotic cataract of the left eye.  H25.12  POSTOPERATIVE DIAGNOSIS:  Nuclear sclerotic cataract of the left eye.   PROCEDURE:  Phacoemulsification with Toric posterior chamber intraocular lens placement of the left eye.  Ultrasound time: Procedure(s): PHACOEMULSIFICATION, CATARACT, WITH IOL INSERTION 3.60 00:22.8 (Left)  LENS:   Implant Name Type Inv. Item Serial No. Manufacturer Lot No. LRB No. Used Action  LENS IOL CLRN PAN PRO TORIC 11 - [id number]  LENS IOL CLRN PAN PRO TORIC 11 [id number] [employer]  Left 1 Implanted     [id number] 11.0 Panoptix ProToric intraocular lens with 1.5 diopters of cylindrical power with axis orientation at 81 degrees.     SURGEON:  Dene FABIENE Etienne, MD   ANESTHESIA:  Topical with tetracaine  drops and 2% Xylocaine  jelly, augmented with 1% preservative-free intracameral lidocaine .  COMPLICATIONS:  None.   DESCRIPTION OF PROCEDURE:  The patient was identified in the holding room and transported to the operating suite and placed in the supine position under the operating microscope.  The left eye was identified as the operative eye, and it was prepped and draped in the usual sterile ophthalmic fashion.    A clear-corneal paracentesis incision was made at the 1:30 position.  0.5 ml of preservative-free 1% lidocaine  was injected into the anterior chamber. The anterior chamber was filled with Viscoat.  A 2.4 millimeter near clear corneal incision was then made at the 10:30 position.  A cystotome and capsulorrhexis forceps were then used to make a curvilinear capsulorrhexis.  Hydrodissection and hydrodelineation were then performed using balanced salt solution.   Phacoemulsification was then used in stop and chop fashion to remove the lens, nucleus and epinucleus.  The remaining cortex was aspirated using the irrigation and aspiration handpiece.  Provisc viscoelastic was then placed into the  capsular bag to distend it for lens placement.  The Verion digital marker was used to align the implant at the intended axis.   A Toric lens was then injected into the capsular bag.  It was rotated clockwise until the axis marks on the lens were approximately 15 degrees in the counterclockwise direction to the intended alignment.  The viscoelastic was aspirated from the eye using the irrigation aspiration handpiece.  Then, a Koch spatula through the sideport incision was used to rotate the lens in a clockwise direction until the axis markings of the intraocular lens were lined up with the Verion alignment.  Balanced salt solution was then used to hydrate the wounds. Cefuroxime  0.1 ml of a 10mg /ml solution was injected into the anterior chamber for a dose of 1 mg of intracameral antibiotic at the completion of the case.    The eye was noted to have a physiologic pressure and there was no wound leak noted.   Timolol  and Brimonidine  drops were applied to the eye.  The patient was taken to the recovery room in stable condition having had no complications of anesthesia or surgery.  Martha Cohen 04/10/2024, 9:15 AM

## 2024-04-10 NOTE — Transfer of Care (Signed)
 Immediate Anesthesia Transfer of Care Note  Patient: Almarie Julien Margo  Procedure(s) Performed: PHACOEMULSIFICATION, CATARACT, WITH IOL INSERTION 3.60 00:22.8 (Left: Eye)  Patient Location: PACU  Anesthesia Type: MAC  Level of Consciousness: awake, alert  and patient cooperative  Airway and Oxygen Therapy: Patient Spontanous Breathing and Patient connected to supplemental oxygen  Post-op Assessment: Post-op Vital signs reviewed, Patient's Cardiovascular Status Stable, Respiratory Function Stable, Patent Airway and No signs of Nausea or vomiting  Post-op Vital Signs: Reviewed and stable  Complications: No notable events documented.

## 2024-04-10 NOTE — H&P (Signed)
 [facility]   Primary Care Physician:  Marikay Eva POUR, [place] Ophthalmologist: Dr. Dene Etienne  Pre-Procedure History & Physical: HPI:  Martha Cohen is a 63 y.o. female here for ophthalmic surgery.   Past Medical History:  Diagnosis Date   Graves disease    Hypertension    Hypothyroidism     Past Surgical History:  Procedure Laterality Date   COLONOSCOPY     COLONOSCOPY WITH PROPOFOL  N/A 11/23/2015   Procedure: COLONOSCOPY WITH PROPOFOL ;  Surgeon: Gladis RAYMOND Mariner, MD;  Location: [facility] ENDOSCOPY;  Service: Endoscopy;  Laterality: N/A;    Prior to Admission medications   Medication Sig Start Date End Date Taking? Authorizing Provider  cholecalciferol (VITAMIN D) 1000 units tablet Take 2,000 Units by mouth daily.   Yes [provider]  levothyroxine (SYNTHROID, LEVOTHROID) 100 MCG tablet Take 100 mcg by mouth daily before breakfast.   Yes [provider]  norgestrel-ethinyl estradiol (LO/OVRAL,CRYSELLE) 0.3-30 MG-MCG tablet Take 1 tablet by mouth daily.    [provider]    Allergies as of 01/12/2024   (Not on File)    Family History  Problem Relation Age of Onset   Breast cancer Neg Hx     Social History   Socioeconomic History   Marital status: Single    Spouse name: Not on file   Number of children: Not on file   Years of education: Not on file   Highest education level: Not on file  Occupational History   Not on file  Tobacco Use   Smoking status: Never   Smokeless tobacco: Never  Substance and Sexual Activity   Alcohol use: No   Drug use: No   Sexual activity: Not on file  Other Topics Concern   Not on file  Social History Narrative   Not on file   Social Drivers of Health   Financial Resource Strain: Low Risk  (11/20/2023)   Received from [facility]   Overall Financial Resource Strain (CARDIA)    Difficulty of Paying Living Expenses: Not hard at all  Food Insecurity: No  Food Insecurity (11/20/2023)   Received from [facility]   Hunger Vital Sign    Within the past 12 months, you worried that your food would run out before you got the money to buy more.: Never true    Within the past 12 months, the food you bought just didn't last and you didn't have money to get more.: Never true  Transportation Needs: No Transportation Needs (11/20/2023)   Received from [facility] - Transportation    In the past 12 months, has lack of transportation kept you from medical appointments or from getting medications?: No    Lack of Transportation (Non-Medical): No  Physical Activity: Not on file  Stress: Not on file  Social Connections: Not on file  Intimate Partner Violence: Not on file    Review of Systems: See HPI, otherwise negative ROS  Physical Exam: BP (!) 151/83   Pulse (!) 54   Temp 98.1 F (36.7 C) (Temporal)   Resp 20   Ht 5' 3 (1.6 m)   Wt 54.9 kg   SpO2 100%   BMI 21.43 kg/m  General:   Alert,  pleasant and cooperative in NAD Head:  Normocephalic and atraumatic. Lungs:  Clear to auscultation.    Heart:  Regular rate and rhythm.   Impression/Plan: Martha Cohen is here for ophthalmic surgery.  Risks, benefits, limitations, and alternatives regarding ophthalmic surgery have been reviewed with the patient.  Questions have been answered.  All parties agreeable.   MITTIE GASKIN, MD  04/10/2024, 8:33 AM

## 2024-04-11 ENCOUNTER — Encounter: Payer: Self-pay | Admitting: Ophthalmology

## 2024-04-12 NOTE — Anesthesia Postprocedure Evaluation (Signed)
 Anesthesia Post Note  Patient: Martha Cohen  Procedure(s) Performed: PHACOEMULSIFICATION, CATARACT, WITH IOL INSERTION 3.60 00:22.8 (Left: Eye)  Patient location during evaluation: PACU Anesthesia Type: MAC Level of consciousness: awake and alert Pain management: pain level controlled Vital Signs Assessment: post-procedure vital signs reviewed and stable Respiratory status: spontaneous breathing, nonlabored ventilation, respiratory function stable and patient connected to nasal cannula oxygen Cardiovascular status: stable and blood pressure returned to baseline Postop Assessment: no apparent nausea or vomiting Anesthetic complications: no   No notable events documented.   Last Vitals:  Vitals:   04/10/24 0917 04/10/24 0921  BP: 128/74 129/71  Pulse: 62 (!) 55  Resp: 12 12  Temp: (!) 36.2 C (!) 36.2 C  SpO2: 100% 99%    Last Pain:  Vitals:   04/11/24 0959  TempSrc:   PainSc: 0-No pain                 Donny JAYSON Mu

## 2024-04-15 NOTE — Anesthesia Preprocedure Evaluation (Addendum)
 Anesthesia Evaluation  Patient identified by MRN, date of birth, ID band Patient awake    Reviewed: Allergy & Precautions, H&P , NPO status , Patient's Chart, lab work & pertinent test results  Airway Mallampati: I  TM Distance: >3 FB Neck ROM: Full    Dental no notable dental hx.  Beautiful, perfect teeth:   Pulmonary neg pulmonary ROS   Pulmonary exam normal breath sounds clear to auscultation       Cardiovascular hypertension, negative cardio ROS Normal cardiovascular exam Rhythm:Regular Rate:Normal     Neuro/Psych negative neurological ROS  negative psych ROS   GI/Hepatic negative GI ROS, Neg liver ROS,,,  Endo/Other  negative endocrine ROSHypothyroidism    Renal/GU negative Renal ROS  negative genitourinary   Musculoskeletal negative musculoskeletal ROS (+)    Abdominal   Peds negative pediatric ROS (+)  Hematology negative hematology ROS (+)   Anesthesia Other Findings Previous cataract surgery 04-10-24 Dr. Ola   HTN Hypothyroid Grave's disease  Reproductive/Obstetrics negative OB ROS                              Anesthesia Physical Anesthesia Plan  ASA: 2  Anesthesia Plan: MAC   Post-op Pain Management:    Induction: Intravenous  PONV Risk Score and Plan:   Airway Management Planned: Natural Airway and Nasal Cannula  Additional Equipment:   Intra-op Plan:   Post-operative Plan:   Informed Consent: I have reviewed the patients History and Physical, chart, labs and discussed the procedure including the risks, benefits and alternatives for the proposed anesthesia with the patient or authorized representative who has indicated his/her understanding and acceptance.     Dental Advisory Given  Plan Discussed with: Anesthesiologist, CRNA and Surgeon  Anesthesia Plan Comments: (Patient consented for risks of anesthesia including but not limited to:  - adverse  reactions to medications - damage to eyes, teeth, lips or other oral mucosa - nerve damage due to positioning  - sore throat or hoarseness - Damage to heart, brain, nerves, lungs, other parts of body or loss of life  Patient voiced understanding and assent.)         Anesthesia Quick Evaluation

## 2024-04-18 NOTE — Discharge Instructions (Signed)

## 2024-04-24 ENCOUNTER — Other Ambulatory Visit: Payer: Self-pay

## 2024-04-24 ENCOUNTER — Ambulatory Visit: Payer: PRIVATE HEALTH INSURANCE | Admitting: Anesthesiology

## 2024-04-24 ENCOUNTER — Encounter
Admission: RE | Disposition: A | Discharge status: Home / Self Care | Payer: Self-pay | Source: Home / Self Care | Attending: Ophthalmology

## 2024-04-24 ENCOUNTER — Ambulatory Visit
Admission: RE | Admit: 2024-04-24 | Discharge: 2024-04-24 | Disposition: A | Discharge status: Home / Self Care | Payer: PRIVATE HEALTH INSURANCE | Attending: Ophthalmology | Admitting: Ophthalmology

## 2024-04-24 ENCOUNTER — Encounter: Payer: Self-pay | Admitting: Ophthalmology

## 2024-04-24 DIAGNOSIS — H2511 Age-related nuclear cataract, right eye: Secondary | ICD-10-CM | POA: Diagnosis present

## 2024-04-24 DIAGNOSIS — I10 Essential (primary) hypertension: Secondary | ICD-10-CM | POA: Insufficient documentation

## 2024-04-24 DIAGNOSIS — E039 Hypothyroidism, unspecified: Secondary | ICD-10-CM | POA: Diagnosis not present

## 2024-04-24 HISTORY — PX: CATARACT EXTRACTION W/PHACO: SHX586

## 2024-04-24 SURGERY — PHACOEMULSIFICATION, CATARACT, WITH IOL INSERTION
Anesthesia: Monitor Anesthesia Care | Laterality: Right

## 2024-04-24 MED ORDER — LIDOCAINE HCL (PF) 2 % IJ SOLN
INTRAOCULAR | Status: DC | PRN
  Administered 2024-04-24 (×2): 1 mL

## 2024-04-24 MED ORDER — SIGHTPATH DOSE#1 BSS IO SOLN
INTRAOCULAR | Status: DC | PRN
Start: 2024-04-24 — End: 2024-04-24
  Administered 2024-04-24 (×2): 15 mL via INTRAOCULAR

## 2024-04-24 MED ORDER — MIDAZOLAM HCL 2 MG/2ML IJ SOLN
INTRAMUSCULAR | Status: DC | PRN
  Administered 2024-04-24 (×4): 1 mg via INTRAVENOUS

## 2024-04-24 MED ORDER — SIGHTPATH DOSE#1 BSS IO SOLN
INTRAOCULAR | Status: DC | PRN
  Administered 2024-04-24 (×2): 47 mL via OPHTHALMIC

## 2024-04-24 MED ORDER — SIGHTPATH DOSE#1 NA HYALUR & NA CHOND-NA HYALUR IO KIT
PACK | INTRAOCULAR | Status: DC | PRN
  Administered 2024-04-24 (×2): 1 via OPHTHALMIC

## 2024-04-24 MED ORDER — FENTANYL CITRATE (PF) 100 MCG/2ML IJ SOLN
INTRAMUSCULAR | Status: AC
  Filled 2024-04-24: qty 2

## 2024-04-24 MED ORDER — ARMC OPHTHALMIC DILATING DROPS
1.0000 | OPHTHALMIC | Status: DC | PRN
Start: 2024-04-24 — End: 2024-04-24
  Administered 2024-04-24 (×4): 1 via OPHTHALMIC

## 2024-04-24 MED ORDER — CEFUROXIME OPHTHALMIC INJECTION 1 MG/0.1 ML
INJECTION | OPHTHALMIC | Status: DC | PRN
  Administered 2024-04-24 (×2): 1 mg via INTRACAMERAL

## 2024-04-24 MED ORDER — LACTATED RINGERS IV SOLN: INTRAVENOUS | Status: DC

## 2024-04-24 MED ORDER — TETRACAINE HCL 0.5 % OP SOLN
1.0000 [drp] | OPHTHALMIC | Status: DC | PRN
  Administered 2024-04-24 (×6): 1 [drp] via OPHTHALMIC

## 2024-04-24 MED ORDER — MIDAZOLAM HCL 2 MG/2ML IJ SOLN
INTRAMUSCULAR | Status: AC
  Filled 2024-04-24: qty 2

## 2024-04-24 MED ORDER — FENTANYL CITRATE (PF) 100 MCG/2ML IJ SOLN
INTRAMUSCULAR | Status: DC | PRN
  Administered 2024-04-24 (×4): 50 ug via INTRAVENOUS

## 2024-04-24 MED ORDER — BRIMONIDINE TARTRATE-TIMOLOL 0.2-0.5 % OP SOLN
OPHTHALMIC | Status: DC | PRN
Start: 2024-04-24 — End: 2024-04-24
  Administered 2024-04-24 (×2): 1 [drp] via OPHTHALMIC

## 2024-04-24 MED ORDER — TETRACAINE HCL 0.5 % OP SOLN
OPHTHALMIC | Status: AC
  Filled 2024-04-24: qty 4

## 2024-04-24 MED ORDER — ARMC OPHTHALMIC DILATING DROPS
OPHTHALMIC | Status: AC
  Filled 2024-04-24: qty 0.5

## 2024-04-24 SURGICAL SUPPLY — 9 items
FEE CATARACT SUITE SIGHTPATH (MISCELLANEOUS) ×1 IMPLANT
GLOVE BIOGEL PI IND STRL 8 (GLOVE) ×1 IMPLANT
GLOVE SURG LX STRL 7.5 STRW (GLOVE) ×1 IMPLANT
GLOVE SURG SYN 6.5 PF PI BL (GLOVE) ×1 IMPLANT
LENS IOL CLRN PAN PRO TRC 11.5 ×1 IMPLANT
LENS IOL CLRN PANO TRC 3 11.5 IMPLANT
NDL FILTER BLUNT 18X1 1/2 (NEEDLE) ×1 IMPLANT
NEEDLE FILTER BLUNT 18X1 1/2 (NEEDLE) ×1 IMPLANT
SYR 3ML LL SCALE MARK (SYRINGE) ×1 IMPLANT

## 2024-04-24 NOTE — Op Note (Signed)
 LOCATION:  [facility]   PREOPERATIVE DIAGNOSIS:  Nuclear sclerotic cataract of the right eye.  H25.11   POSTOPERATIVE DIAGNOSIS:  Nuclear sclerotic cataract of the right eye.   PROCEDURE:  Phacoemulsification with Toric posterior chamber intraocular lens placement of the right eye.  Ultrasound time: Procedure(s): PHACOEMULSIFICATION, CATARACT, WITH IOL INSERTION 3.01, 00:16.1 (Right)  LENS:   Implant Name Type Inv. Item Serial No. Manufacturer Lot No. LRB No. Used Action  LENS IOL CLRN PAN PRO TRC 11.5 - [id number]  LENS IOL CLRN PAN PRO TRC 11.5 [id number] [employer]  Right 1 Implanted     PXYAT3 11.5 Panoptix Toric intraocular lens with 1.5 diopters of cylindrical power with axis orientation at 72 degrees.   SURGEON:  Dene FABIENE Etienne, MD   ANESTHESIA: Topical with tetracaine  drops and 2% Xylocaine  jelly, augmented with 1% preservative-free intracameral lidocaine . .   COMPLICATIONS:  None.   DESCRIPTION OF PROCEDURE:  The patient was identified in the holding room and transported to the operating suite and placed in the supine position under the operating microscope.  The right eye was identified as the operative eye, and it was prepped and draped in the usual sterile ophthalmic fashion.    A clear-corneal paracentesis incision was made at the 12:00 position.  0.5 ml of preservative-free 1% lidocaine  was injected into the anterior chamber. The anterior chamber was filled with Viscoat.  A 2.4 millimeter near clear corneal incision was then made at the 9:00 position.  A cystotome and capsulorrhexis forceps were then used to make a curvilinear capsulorrhexis.  Hydrodissection and hydrodelineation were then performed using balanced salt solution.   Phacoemulsification was then used in stop and chop fashion to remove the lens, nucleus and epinucleus.  The remaining cortex was aspirated using the irrigation and aspiration handpiece.  Provisc viscoelastic was then placed into  the capsular bag to distend it for lens placement.  The Verion digital marker was used to align the implant at the intended axis.   A Toric lens was then injected into the capsular bag.  It was rotated clockwise until the axis marks on the lens were approximately 15 degrees in the counterclockwise direction to the intended alignment.  The viscoelastic was aspirated from the eye using the irrigation aspiration handpiece.  Then, a Koch spatula through the sideport incision was used to rotate the lens in a clockwise direction until the axis markings of the intraocular lens were lined up with the Verion alignment.  Balanced salt solution was then used to hydrate the wounds. Cefuroxime  0.1 ml of a 10mg /ml solution was injected into the anterior chamber for a dose of 1 mg of intracameral antibiotic at the completion of the case.    The eye was noted to have a physiologic pressure and there was no wound leak noted.   Timolol  and Brimonidine  drops were applied to the eye.  The patient was taken to the recovery room in stable condition having had no complications of anesthesia or surgery.  Marabeth Melland 04/24/2024, 11:40 AM

## 2024-04-24 NOTE — H&P (Signed)
 [facility]   Primary Care Physician:  Marikay Eva POUR, [place] Ophthalmologist: Dr. Dene Etienne  Pre-Procedure History & Physical: HPI:  Martha Cohen is a 63 y.o. female here for ophthalmic surgery.   Past Medical History:  Diagnosis Date   Graves disease    Hypertension    Hypothyroidism     Past Surgical History:  Procedure Laterality Date   CATARACT EXTRACTION W/PHACO Left 04/10/2024   Procedure: PHACOEMULSIFICATION, CATARACT, WITH IOL INSERTION 3.60 00:22.8;  Surgeon: Etienne Dene, MD;  Location: [facility];  Service: Ophthalmology;  Laterality: Left;   COLONOSCOPY     COLONOSCOPY WITH PROPOFOL  N/A 11/23/2015   Procedure: COLONOSCOPY WITH PROPOFOL ;  Surgeon: Gladis RAYMOND Mariner, MD;  Location: [facility];  Service: Endoscopy;  Laterality: N/A;    Prior to Admission medications   Medication Sig Start Date End Date Taking? Authorizing Provider  cholecalciferol (VITAMIN D) 1000 units tablet Take 2,000 Units by mouth daily.   Yes [provider]  levothyroxine (SYNTHROID, LEVOTHROID) 100 MCG tablet Take 100 mcg by mouth daily before breakfast.   Yes [provider]  norgestrel-ethinyl estradiol (LO/OVRAL,CRYSELLE) 0.3-30 MG-MCG tablet Take 1 tablet by mouth daily.    [provider]    Allergies as of 01/12/2024   (Not on File)    Family History  Problem Relation Age of Onset   Breast cancer Neg Hx     Social History   Socioeconomic History   Marital status: Single    Spouse name: Not on file   Number of children: Not on file   Years of education: Not on file   Highest education level: Not on file  Occupational History   Not on file  Tobacco Use   Smoking status: Never   Smokeless tobacco: Never  Substance and Sexual Activity   Alcohol use: No   Drug use: No   Sexual activity: Not on file  Other Topics Concern   Not on file  Social History Narrative   Not on file   Social Drivers  of Health   Financial Resource Strain: Low Risk  (11/20/2023)   Received from [facility]   Overall Financial Resource Strain (CARDIA)    Difficulty of Paying Living Expenses: Not hard at all  Food Insecurity: No Food Insecurity (11/20/2023)   Received from [facility]   Hunger Vital Sign    Within the past 12 months, you worried that your food would run out before you got the money to buy more.: Never true    Within the past 12 months, the food you bought just didn't last and you didn't have money to get more.: Never true  Transportation Needs: No Transportation Needs (11/20/2023)   Received from [facility] - Transportation    In the past 12 months, has lack of transportation kept you from medical appointments or from getting medications?: No    Lack of Transportation (Non-Medical): No  Physical Activity: Not on file  Stress: Not on file  Social Connections: Not on file  Intimate Partner Violence: Not on file    Review of Systems: See HPI, otherwise negative ROS  Physical Exam: BP (!) 141/66   Pulse (!) 58   Temp 98.1 F (36.7 C) (Temporal)   Resp 13   Ht 5' 3 (1.6 m)   Wt 55.6 kg   SpO2 95%   BMI 21.72 kg/m  General:   Alert,  pleasant and cooperative  in NAD Head:  Normocephalic and atraumatic. Lungs:  Clear to auscultation.    Heart:  Regular rate and rhythm.   Impression/Plan: Martha Cohen is here for ophthalmic surgery.  Risks, benefits, limitations, and alternatives regarding ophthalmic surgery have been reviewed with the patient.  Questions have been answered.  All parties agreeable.   MITTIE GASKIN, MD  04/24/2024, 10:59 AM

## 2024-04-24 NOTE — Transfer of Care (Signed)
 Immediate Anesthesia Transfer of Care Note  Patient: Martha Cohen  Procedure(s) Performed: PHACOEMULSIFICATION, CATARACT, WITH IOL INSERTION 3.01, 00:16.1 (Right)  Patient Location: PACU  Anesthesia Type: MAC  Level of Consciousness: awake, alert  and patient cooperative  Airway and Oxygen Therapy: Patient Spontanous Breathing and Patient connected to supplemental oxygen  Post-op Assessment: Post-op Vital signs reviewed, Patient's Cardiovascular Status Stable, Respiratory Function Stable, Patent Airway and No signs of Nausea or vomiting  Post-op Vital Signs: Reviewed and stable  Complications: No notable events documented.

## 2024-04-24 NOTE — Anesthesia Postprocedure Evaluation (Signed)
 Anesthesia Post Note  Patient: Martha Cohen  Procedure(s) Performed: PHACOEMULSIFICATION, CATARACT, WITH IOL INSERTION 3.01, 00:16.1 (Right)  Patient location during evaluation: PACU Anesthesia Type: MAC Level of consciousness: awake and alert Pain management: pain level controlled Vital Signs Assessment: post-procedure vital signs reviewed and stable Respiratory status: spontaneous breathing, nonlabored ventilation, respiratory function stable and patient connected to nasal cannula oxygen Cardiovascular status: stable and blood pressure returned to baseline Postop Assessment: no apparent nausea or vomiting Anesthetic complications: no   No notable events documented.   Last Vitals:  Vitals:   04/24/24 1142 04/24/24 1145  BP: 124/79 121/74  Pulse: (!) 54 (!) 59  Resp: 18 14  Temp: (!) 36.4 C (!) 36.4 C  SpO2: 97% 98%    Last Pain:  Vitals:   04/24/24 1145  TempSrc:   PainSc: 0-No pain                 Sesar Madewell C Brnadon Eoff

## 2024-04-25 ENCOUNTER — Encounter: Payer: Self-pay | Admitting: Ophthalmology
